# Patient Record
Sex: Male | Born: 1960 | Race: White | Hispanic: No | Marital: Married | Smoking: Never smoker
Health system: Southern US, Community
[De-identification: ages and names within clinical notes are randomized; demographics above are authoritative.]

---

## 2003-04-09 ENCOUNTER — Encounter: Payer: Self-pay | Admitting: *Deleted

## 2003-04-09 ENCOUNTER — Ambulatory Visit (HOSPITAL_COMMUNITY): Admission: RE | Admit: 2003-04-09 | Discharge: 2003-04-09 | Payer: Self-pay | Admitting: *Deleted

## 2005-12-02 ENCOUNTER — Ambulatory Visit (HOSPITAL_COMMUNITY): Admission: AD | Admit: 2005-12-02 | Discharge: 2005-12-02 | Payer: Self-pay | Admitting: Orthopedic Surgery

## 2007-07-14 IMAGING — RF DG CLAVICLE*R*
1 series · 6 of 6 positions shown · non-contrast
Comparison: none

CLINICAL DATA: ORIF clavicular fracture.  
 RIGHT CLAVICLE ? 5 FLUOROSCOPIC GUIDED SPOTS FILMS:

[Series 1: run · 6 of 6 slices shown]
[im 1/6]
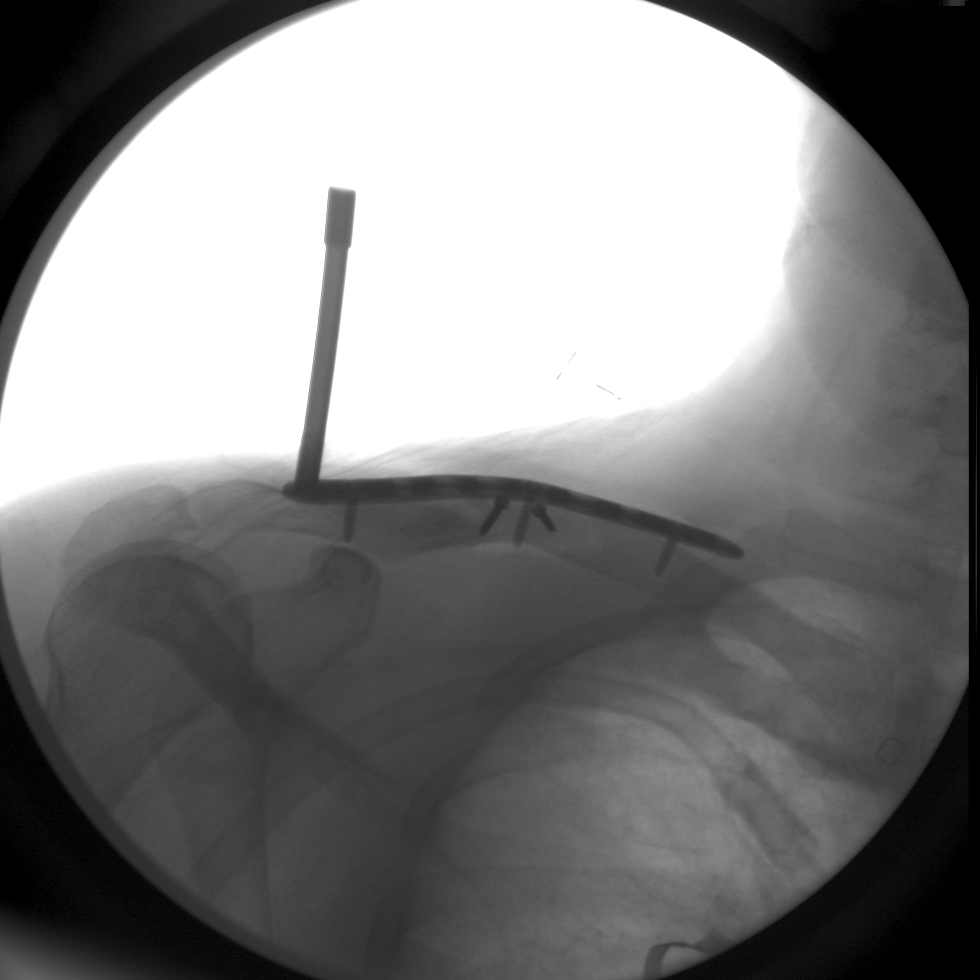
[im 2/6]
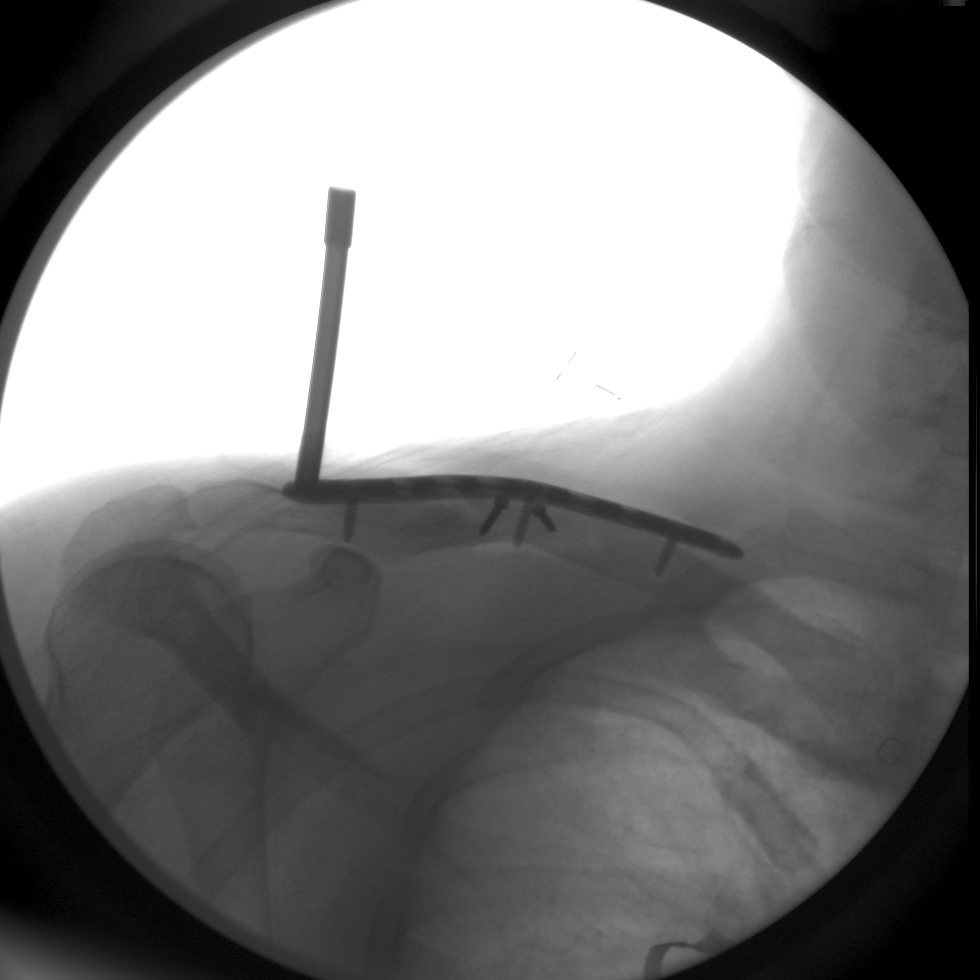
[im 3/6]
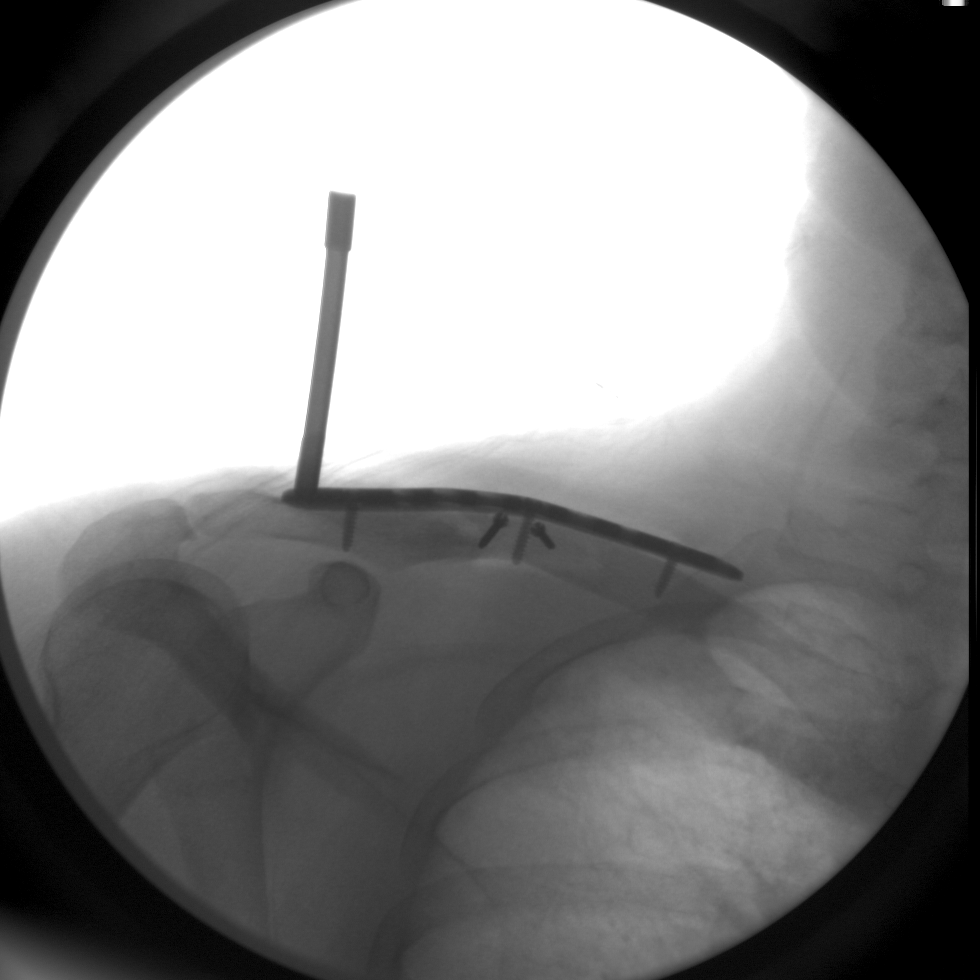
[im 4/6]
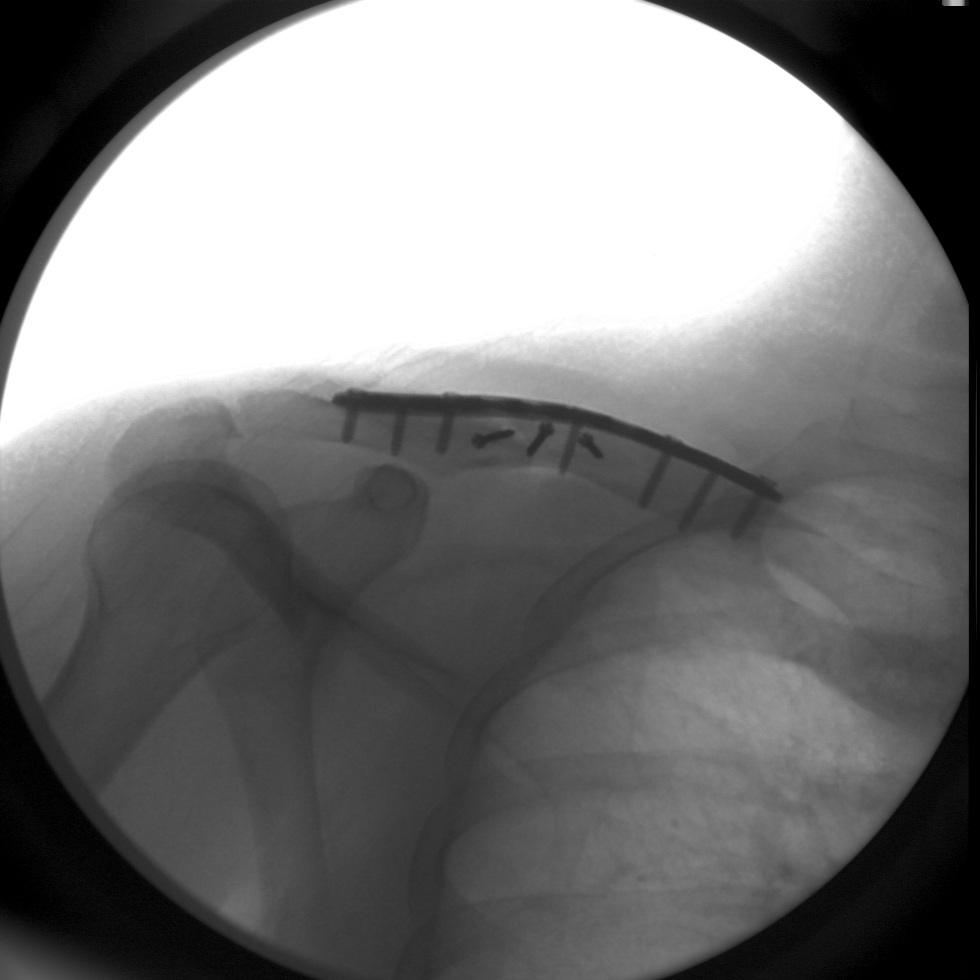
[im 5/6]
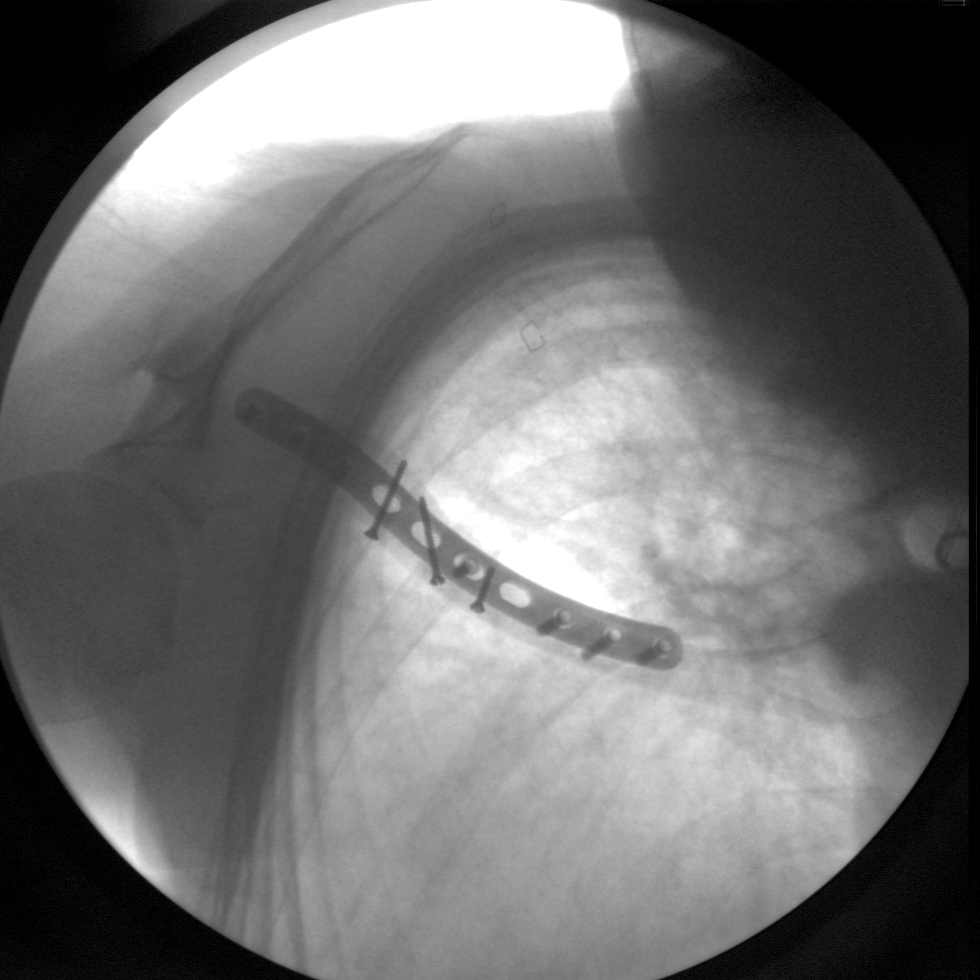
[im 6/6]
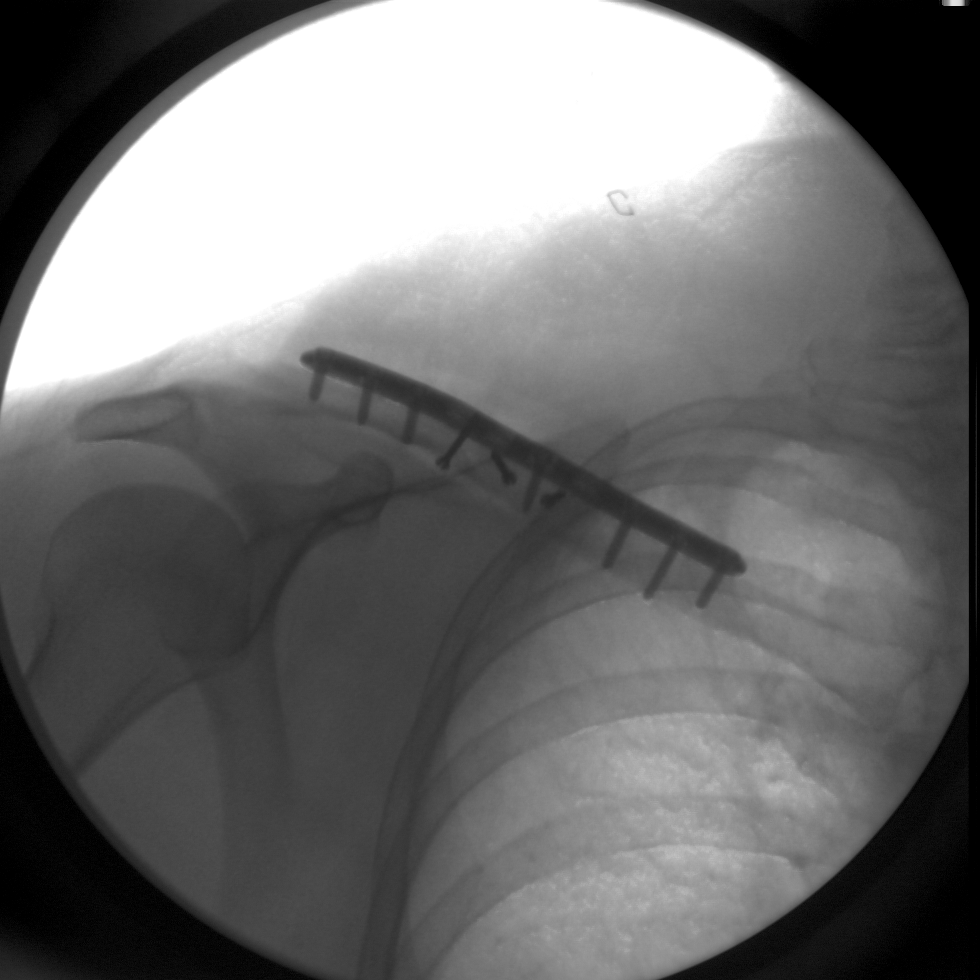

[6 of 6 positions shown; findings below may reference images not displayed]

FINDINGS: Right clavicular fracture has been reduced with contour plate and screw fixation.  Plate lies along the superior aspect of the clavicle.  Seven screws traverse the plate and clavicle and three screws are noted transfixing the fracture site without extending through the plate.  Fracture fragments appear in satisfactory position and alignment.
IMPRESSION: Satisfactory ORIF right clavicle.

## 2020-11-04 ENCOUNTER — Ambulatory Visit (INDEPENDENT_AMBULATORY_CARE_PROVIDER_SITE_OTHER): Payer: BC Managed Care – PPO | Admitting: Ophthalmology

## 2020-11-04 ENCOUNTER — Other Ambulatory Visit: Payer: Self-pay

## 2020-11-04 ENCOUNTER — Encounter (INDEPENDENT_AMBULATORY_CARE_PROVIDER_SITE_OTHER): Payer: Self-pay | Admitting: Ophthalmology

## 2020-11-04 DIAGNOSIS — H2013 Chronic iridocyclitis, bilateral: Secondary | ICD-10-CM | POA: Diagnosis not present

## 2020-11-04 DIAGNOSIS — H2513 Age-related nuclear cataract, bilateral: Secondary | ICD-10-CM

## 2020-11-04 DIAGNOSIS — H43812 Vitreous degeneration, left eye: Secondary | ICD-10-CM

## 2020-11-04 DIAGNOSIS — H43811 Vitreous degeneration, right eye: Secondary | ICD-10-CM | POA: Diagnosis not present

## 2020-11-04 DIAGNOSIS — H21543 Posterior synechiae (iris), bilateral: Secondary | ICD-10-CM | POA: Insufficient documentation

## 2020-11-04 MED ORDER — PREDNISOLONE ACETATE 1 % OP SUSP: 1.0000 [drp] | OPHTHALMIC | 0 refills | Status: AC

## 2020-11-04 NOTE — Progress Notes (Signed)
11/04/2020     CHIEF COMPLAINT Patient presents for Retina Follow Up   HISTORY OF PRESENT ILLNESS: Eric Ibarra is a 59 y.o. 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  Severity is moderate.  Duration of 1 year.  Since onset it is stable.  I, the attending physician,  performed the HPI with the patient and updated documentation appropriately.          Comments    1 Year f\u OU. OCT ,  With a history of chronic recurrent iritis no flareups for some years yet patient concerned he may have a current flareup, OD only  Pt c/o feeling pressure in OU and states he may have some inflammation x 3 weeks. Pt states he has been using readers more than usual.       Last edited by Edmon Crape, MD on 11/04/2020  9:05 AM. (History)      Referring physician: No referring provider defined for this encounter.  HISTORICAL INFORMATION:   Selected notes from the MEDICAL RECORD NUMBER       CURRENT MEDICATIONS: Current Outpatient Medications (Ophthalmic Drugs)  Medication Sig  . prednisoLONE acetate (PRED FORTE) 1 % ophthalmic suspension Place 1 drop into both eyes as directed. Patient to have this medication on hand as a as needed basis for mild iritis   No current facility-administered medications for this visit. (Ophthalmic Drugs)   No current outpatient medications on file. (Other)   No current facility-administered medications for this visit. (Other)      REVIEW OF SYSTEMS:    ALLERGIES No Known Allergies  PAST MEDICAL HISTORY History reviewed. No pertinent past medical history. History reviewed. No pertinent surgical history.  FAMILY HISTORY History reviewed. No pertinent family history.  SOCIAL HISTORY Social History   Tobacco Use  . Smoking status: Never Smoker  . Smokeless tobacco: Never Used  Substance Use Topics  . Alcohol use: Not on file  . Drug  use: Not on file         OPHTHALMIC EXAM:  Base Eye Exam    Visual Acuity (Snellen - Linear)      Right Left   Dist [place] 20/20 -2 20/20       Tonometry (Tonopen, 8:18 AM)      Right Left   Pressure 12 12       Pupils      Pupils Dark Light Shape React APD   Right PERRL 4 3 Round Brisk None   Left PERRL 4 3 Round Brisk None       Visual Fields (Counting fingers)      Left Right    Full Full       Neuro/Psych    Oriented x3: Yes   Mood/Affect: Normal       Dilation    Both eyes: 1.0% Mydriacyl, 2.5% Phenylephrine @ 8:18 AM        Slit Lamp and Fundus Exam    External Exam      Right Left   External Normal Normal       Slit Lamp Exam      Right Left   Lids/Lashes Normal Normal   Conjunctiva/Sclera White and quiet White and quiet   Cornea Clear Clear   Anterior Chamber Deep and quiet,, no cells, flare Deep and quiet,, no cells, flare  Iris Round and reactive Round and reactive   Lens 1+ Nuclear sclerosis 1+ Nuclear sclerosis   Anterior Vitreous Normal Normal       Fundus Exam      Right Left   Posterior Vitreous Normal Normal   Disc Normal Normal   C/D Ratio 0.2 0.2   Macula Normal Normal   Vessels Normal Normal   Periphery Normal Normal          IMAGING AND PROCEDURES  Imaging and Procedures for 11/18/20  OCT, Retina - OU - Both Eyes       Right Eye Quality was good. Scan locations included subfoveal. Central Foveal Thickness: 281. Progression has been stable. Findings include normal foveal contour, vitreomacular adhesion .   Left Eye Quality was good. Scan locations included subfoveal. Central Foveal Thickness: 283. Progression has been stable. Findings include normal foveal contour, vitreomacular adhesion .   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  An informational brochure was given to the patient.  All the patient's questions were answered.  The patient was asked to return if new or different flashes or floaters develops.   Patient was instructed to contact office immediately if any changes were noticed. I explained to the patient that vitreous inside the eye is similar to jello inside a bowl. As the jello melts it can start to pull away from the bowl, similarly the vitreous throughout our lives can begin to pull away from the retina. That process is called a posterior vitreous detachment. In some cases, the vitreous can tug hard enough on the retina to form a retinal tear. I discussed with the patient the signs and symptoms of a retinal detachment.  Do not rub the eye.      ICD-10-CM   1. Chronic iridocyclitis of both eyes  H20.13   2. Posterior synechiae (iris), bilateral  H21.543   3. Nuclear sclerotic cataract of both eyes  H25.13   4. Posterior vitreous detachment of right eye  H43.811 OCT, Retina - OU - Both  Eyes  5. Posterior vitreous detachment of left eye  H43.812 OCT, Retina - OU - Both Eyes    1.  No active disease OU, will continue to monitor  2.  Visually significant cataract OU  3.  Patient continues to self treat  periodically as his chronic iritis minimally flares over the years.  No other complications detected at this time  Ophthalmic Meds Ordered this visit:  Meds ordered this encounter  Medications  . prednisoLONE acetate (PRED FORTE) 1 % ophthalmic suspension    Sig: Place 1 drop into both eyes as directed. Patient to have this medication on hand as a as needed basis for mild iritis    Dispense:  5 mL    Refill:  0       Return in about 1 year (around 11/04/2021) for DILATE OU, OCT.  Patient Instructions  Patient experience in the use of tapering topical Pred forte, he will taper his current regimen to once daily for a week, once every other day for a week and then discontinue its use and use as needed basis only but in limited fashion no more than 2 to 3 weeks straight without being seen to confirm warm or assess for secondary side effect such as glaucoma    Explained the diagnoses, plan, and follow up with the patient and they expressed understanding.  Patient expressed understanding of the importance of proper follow up care.   Alford Highland Broxton Broady M.D. Diseases & Surgery of the Retina and Vitreous [facility] 11/18/20     Abbreviations: M myopia (nearsighted); A astigmatism; H hyperopia (farsighted); P presbyopia; Mrx spectacle prescription;  CTL contact lenses; OD right eye; OS left eye; OU both eyes  XT exotropia; ET esotropia; PEK punctate epithelial keratitis; PEE punctate epithelial erosions; DES dry eye syndrome; MGD meibomian gland dysfunction; ATs artificial tears; PFAT's preservative free artificial tears; NSC nuclear sclerotic cataract; PSC posterior subcapsular cataract; ERM epi-retinal membrane; PVD posterior vitreous detachment; RD retinal  detachment; DM diabetes mellitus; DR diabetic retinopathy; NPDR non-proliferative diabetic retinopathy; PDR proliferative diabetic retinopathy; CSME clinically significant macular edema; DME diabetic macular edema; dbh dot blot hemorrhages; CWS cotton wool spot; POAG primary open angle glaucoma; C/D cup-to-disc ratio; HVF humphrey visual field; GVF goldmann visual field; OCT optical coherence tomography; IOP intraocular pressure; BRVO Branch retinal vein occlusion; CRVO central retinal vein occlusion; CRAO central retinal artery occlusion; BRAO branch retinal artery occlusion; RT retinal tear; SB scleral buckle; PPV pars plana vitrectomy; VH Vitreous hemorrhage; PRP panretinal laser photocoagulation; IVK intravitreal kenalog; VMT vitreomacular traction; MH Macular hole;  NVD neovascularization of the disc; NVE neovascularization elsewhere; AREDS age related eye disease study; ARMD age related macular degeneration; POAG primary open angle glaucoma; EBMD epithelial/anterior basement membrane dystrophy; ACIOL anterior chamber intraocular lens; IOL intraocular lens; PCIOL posterior chamber intraocular lens; Phaco/IOL phacoemulsification with intraocular lens placement; PRK photorefractive keratectomy; LASIK laser assisted in situ keratomileusis; HTN hypertension; DM diabetes mellitus; COPD chronic obstructive pulmonary disease

## 2020-11-04 NOTE — Assessment & Plan Note (Signed)
History of chronic mild recurrent iridocyclitis most commonly in the right eye in the past, yet with a history of mild changes in the distant past currently inactive based upon 10 days of topical Pred forte use right eye twice daily.  Patient has used an old bottle of thus we will update his his medication so he will have this on hand should he have a flareup    No active scarring no active inflammation seen in the anterior vitreous nor in the anterior chamber of either eye today  Recommend taper of topical Pred forte to once daily for a week and then perhaps once every other day for a week and then stop it

## 2020-11-04 NOTE — Patient Instructions (Signed)
Patient experience in the use of tapering topical Pred forte, he will taper his current regimen to once daily for a week, once every other day for a week and then discontinue its use and use as needed basis only but in limited fashion no more than 2 to 3 weeks straight without being seen to confirm warm or assess for secondary side effect such as glaucoma

## 2020-11-18 NOTE — Assessment & Plan Note (Signed)
[place] ring pigment deposition anterior capsule, no scarring now

## 2020-11-18 NOTE — Assessment & Plan Note (Signed)

## 2020-11-18 NOTE — Assessment & Plan Note (Signed)
Minor and not visually significant

## 2021-11-05 ENCOUNTER — Encounter (INDEPENDENT_AMBULATORY_CARE_PROVIDER_SITE_OTHER): Payer: Self-pay | Admitting: Ophthalmology

## 2021-11-05 ENCOUNTER — Ambulatory Visit (INDEPENDENT_AMBULATORY_CARE_PROVIDER_SITE_OTHER): Payer: BC Managed Care – PPO | Admitting: Ophthalmology

## 2021-11-05 ENCOUNTER — Other Ambulatory Visit: Payer: Self-pay

## 2021-11-05 DIAGNOSIS — H2513 Age-related nuclear cataract, bilateral: Secondary | ICD-10-CM

## 2021-11-05 DIAGNOSIS — H43811 Vitreous degeneration, right eye: Secondary | ICD-10-CM

## 2021-11-05 DIAGNOSIS — H2013 Chronic iridocyclitis, bilateral: Secondary | ICD-10-CM | POA: Diagnosis not present

## 2021-11-05 DIAGNOSIS — H21543 Posterior synechiae (iris), bilateral: Secondary | ICD-10-CM

## 2021-11-05 NOTE — Progress Notes (Signed)
11/05/2021     CHIEF COMPLAINT Patient presents for  Chief Complaint  Patient presents with   Retina Follow Up      HISTORY OF PRESENT ILLNESS: Eric Ibarra is a 60 y.o. male who presents to the clinic today for:   HPI     Retina Follow Up   Patient presents with  Other.  In both eyes.  This started 1 year ago.  Duration of 1 year.  Since onset it is stable.        Comments   1 year f/u OU with OCT, no signs of recurrence is no symptoms or recurrences.  On no topical ocular therapy.  In the past the patient did use chronic nightly suppressive ophthalmic ointment, erythromycin or bacitracin for years prior to cessation some 7 years previous      Last edited by Edmon Crape, MD on 11/05/2021  8:54 AM.      Referring physician: No referring provider defined for this encounter.  HISTORICAL INFORMATION:   Selected notes from the MEDICAL RECORD NUMBER       CURRENT MEDICATIONS: No current outpatient medications on file. (Ophthalmic Drugs)   No current facility-administered medications for this visit. (Ophthalmic Drugs)   No current outpatient medications on file. (Other)   No current facility-administered medications for this visit. (Other)      REVIEW OF SYSTEMS:    ALLERGIES No Known Allergies  PAST MEDICAL HISTORY History reviewed. No pertinent past medical history. History reviewed. No pertinent surgical history.  FAMILY HISTORY History reviewed. No pertinent family history.  SOCIAL HISTORY Social History   Tobacco Use   Smoking status: Never   Smokeless tobacco: Never         OPHTHALMIC EXAM:  Base Eye Exam     Visual Acuity (ETDRS)       Right Left   Dist [place] 20/20 -2 20/20         Tonometry (Tonopen, 8:15 AM)       Right Left   Pressure 11 12         Pupils       Pupils Dark Light Shape React APD   Right PERRL 6 4 Round Brisk None   Left PERRL 6 4 Round Brisk None         Visual Fields (Counting  fingers)       Left Right    Full Full         Extraocular Movement       Right Left    Full, Ortho Full, Ortho         Neuro/Psych     Oriented x3: Yes   Mood/Affect: Normal         Dilation     Both eyes: 1.0% Mydriacyl, 2.5% Phenylephrine @ 8:15 AM           Slit Lamp and Fundus Exam     External Exam       Right Left   External Normal Normal         Slit Lamp Exam       Right Left   Lids/Lashes Normal Normal   Conjunctiva/Sclera White and quiet White and quiet   Cornea Clear Clear   Anterior Chamber Deep and quiet,, no cells, flare Deep and quiet,, no cells, flare   Iris Round and reactive Round and reactive   Lens Trace Nuclear sclerosis Trace Nuclear sclerosis   Anterior Vitreous Normal Normal  Fundus Exam       Right Left   Posterior Vitreous Posterior vitreous detachment Posterior vitreous detachment   Disc Normal Normal   C/D Ratio 0.2 0.2   Macula Normal Normal   Vessels Normal Normal   Periphery Normal Normal            IMAGING AND PROCEDURES  Imaging and Procedures for 11/05/21  OCT, Retina - OU - Both Eyes       Right Eye Quality was good. Scan locations included subfoveal. Central Foveal Thickness: 275. Progression has been stable. Findings include normal foveal contour, vitreomacular adhesion .   Left Eye Quality was good. Scan locations included subfoveal. Central Foveal Thickness: 279. Progression has been stable. Findings include normal foveal contour, vitreomacular adhesion .   Notes No signs of active maculopathy OU             ASSESSMENT/PLAN:  Chronic iridocyclitis of both eyes History of chronic recurrent iridocyclitis for many years last flareup some 5 to 8 years previous  No signs of complications in either eye  On no topical medications  Nuclear sclerotic cataract of both eyes Minor, not visually impactful  Posterior synechiae (iris), bilateral No active scarring  OU  Posterior vitreous detachment of right eye Physiologic OU     ICD-10-CM   1. Chronic iridocyclitis of both eyes  H20.13 OCT, Retina - OU - Both Eyes    2. Nuclear sclerotic cataract of both eyes  H25.13     3. Posterior synechiae (iris), bilateral  H21.543     4. Posterior vitreous detachment of right eye  H43.811       1.  Minor nuclear sclerotic cataract changes OU age-appropriate, not impactful on the vision  2.  No signs of chronic scarring or recurrence of chronic iridocyclitis, recurrent in the past not evident for at least 5 to 8 years roughly  3.  Ophthalmic Meds Ordered this visit:  No orders of the defined types were placed in this encounter.      Return in about 1 year (around 11/05/2022) for DILATE OU, OCT.  There are no Patient Instructions on file for this visit.   Explained the diagnoses, plan, and follow up with the patient and they expressed understanding.  Patient expressed understanding of the importance of proper follow up care.   Alford Highland Jermon Chalfant M.D. Diseases & Surgery of the Retina and Vitreous [facility] 11/05/21     Abbreviations: M myopia (nearsighted); A astigmatism; H hyperopia (farsighted); P presbyopia; Mrx spectacle prescription;  CTL contact lenses; OD right eye; OS left eye; OU both eyes  XT exotropia; ET esotropia; PEK punctate epithelial keratitis; PEE punctate epithelial erosions; DES dry eye syndrome; MGD meibomian gland dysfunction; ATs artificial tears; PFAT's preservative free artificial tears; NSC nuclear sclerotic cataract; PSC posterior subcapsular cataract; ERM epi-retinal membrane; PVD posterior vitreous detachment; RD retinal detachment; DM diabetes mellitus; DR diabetic retinopathy; NPDR non-proliferative diabetic retinopathy; PDR proliferative diabetic retinopathy; CSME clinically significant macular edema; DME diabetic macular edema; dbh dot blot hemorrhages; CWS cotton wool spot; POAG primary open angle  glaucoma; C/D cup-to-disc ratio; HVF humphrey visual field; GVF goldmann visual field; OCT optical coherence tomography; IOP intraocular pressure; BRVO Branch retinal vein occlusion; CRVO central retinal vein occlusion; CRAO central retinal artery occlusion; BRAO branch retinal artery occlusion; RT retinal tear; SB scleral buckle; PPV pars plana vitrectomy; VH Vitreous hemorrhage; PRP panretinal laser photocoagulation; IVK intravitreal kenalog; VMT vitreomacular traction; MH Macular hole;  NVD  neovascularization of the disc; NVE neovascularization elsewhere; AREDS age related eye disease study; ARMD age related macular degeneration; POAG primary open angle glaucoma; EBMD epithelial/anterior basement membrane dystrophy; ACIOL anterior chamber intraocular lens; IOL intraocular lens; PCIOL posterior chamber intraocular lens; Phaco/IOL phacoemulsification with intraocular lens placement; [place] photorefractive keratectomy; LASIK laser assisted in situ keratomileusis; HTN hypertension; DM diabetes mellitus; COPD chronic obstructive pulmonary disease

## 2021-11-05 NOTE — Assessment & Plan Note (Signed)
No active scarring OU

## 2021-11-05 NOTE — Assessment & Plan Note (Signed)
Minor, not visually impactful

## 2021-11-05 NOTE — Assessment & Plan Note (Signed)
Physiologic OU 

## 2021-11-05 NOTE — Assessment & Plan Note (Signed)
History of chronic recurrent iridocyclitis for many years last flareup some 5 to 8 years previous  No signs of complications in either eye  On no topical medications

## 2022-03-04 DIAGNOSIS — M25562 Pain in left knee: Secondary | ICD-10-CM | POA: Diagnosis not present

## 2022-03-04 DIAGNOSIS — M25561 Pain in right knee: Secondary | ICD-10-CM | POA: Diagnosis not present

## 2022-03-11 DIAGNOSIS — M531 Cervicobrachial syndrome: Secondary | ICD-10-CM | POA: Diagnosis not present

## 2022-04-29 DIAGNOSIS — M531 Cervicobrachial syndrome: Secondary | ICD-10-CM | POA: Diagnosis not present

## 2022-06-10 DIAGNOSIS — M531 Cervicobrachial syndrome: Secondary | ICD-10-CM | POA: Diagnosis not present

## 2022-07-22 DIAGNOSIS — M531 Cervicobrachial syndrome: Secondary | ICD-10-CM | POA: Diagnosis not present

## 2022-09-02 DIAGNOSIS — M9901 Segmental and somatic dysfunction of cervical region: Secondary | ICD-10-CM | POA: Diagnosis not present

## 2022-09-02 DIAGNOSIS — M50322 Other cervical disc degeneration at C5-C6 level: Secondary | ICD-10-CM | POA: Diagnosis not present

## 2022-09-02 DIAGNOSIS — M5417 Radiculopathy, lumbosacral region: Secondary | ICD-10-CM | POA: Diagnosis not present

## 2022-09-02 DIAGNOSIS — M531 Cervicobrachial syndrome: Secondary | ICD-10-CM | POA: Diagnosis not present

## 2022-10-14 DIAGNOSIS — M50322 Other cervical disc degeneration at C5-C6 level: Secondary | ICD-10-CM | POA: Diagnosis not present

## 2022-10-14 DIAGNOSIS — M531 Cervicobrachial syndrome: Secondary | ICD-10-CM | POA: Diagnosis not present

## 2022-10-14 DIAGNOSIS — M5417 Radiculopathy, lumbosacral region: Secondary | ICD-10-CM | POA: Diagnosis not present

## 2022-10-14 DIAGNOSIS — M9901 Segmental and somatic dysfunction of cervical region: Secondary | ICD-10-CM | POA: Diagnosis not present

## 2022-11-09 ENCOUNTER — Encounter (INDEPENDENT_AMBULATORY_CARE_PROVIDER_SITE_OTHER): Payer: BC Managed Care – PPO | Admitting: Ophthalmology

## 2024-01-02 DIAGNOSIS — M25512 Pain in left shoulder: Secondary | ICD-10-CM | POA: Diagnosis not present

## 2024-01-02 DIAGNOSIS — M25551 Pain in right hip: Secondary | ICD-10-CM | POA: Diagnosis not present

## 2024-01-02 DIAGNOSIS — M25552 Pain in left hip: Secondary | ICD-10-CM | POA: Diagnosis not present

## 2024-01-02 DIAGNOSIS — M25511 Pain in right shoulder: Secondary | ICD-10-CM | POA: Diagnosis not present

## 2024-02-06 DIAGNOSIS — M7502 Adhesive capsulitis of left shoulder: Secondary | ICD-10-CM | POA: Diagnosis not present

## 2024-02-06 DIAGNOSIS — R5383 Other fatigue: Secondary | ICD-10-CM | POA: Diagnosis not present

## 2024-02-06 DIAGNOSIS — T733XXA Exhaustion due to excessive exertion, initial encounter: Secondary | ICD-10-CM | POA: Diagnosis not present

## 2024-02-08 DIAGNOSIS — M25511 Pain in right shoulder: Secondary | ICD-10-CM | POA: Diagnosis not present

## 2024-02-08 DIAGNOSIS — M25512 Pain in left shoulder: Secondary | ICD-10-CM | POA: Diagnosis not present

## 2024-02-13 DIAGNOSIS — M25511 Pain in right shoulder: Secondary | ICD-10-CM | POA: Diagnosis not present

## 2024-02-13 DIAGNOSIS — M25512 Pain in left shoulder: Secondary | ICD-10-CM | POA: Diagnosis not present

## 2024-02-22 DIAGNOSIS — L409 Psoriasis, unspecified: Secondary | ICD-10-CM | POA: Diagnosis not present

## 2024-02-22 DIAGNOSIS — H209 Unspecified iridocyclitis: Secondary | ICD-10-CM | POA: Diagnosis not present

## 2024-02-22 DIAGNOSIS — Z125 Encounter for screening for malignant neoplasm of prostate: Secondary | ICD-10-CM | POA: Diagnosis not present

## 2024-02-22 DIAGNOSIS — M459 Ankylosing spondylitis of unspecified sites in spine: Secondary | ICD-10-CM | POA: Diagnosis not present

## 2024-02-22 DIAGNOSIS — Z1589 Genetic susceptibility to other disease: Secondary | ICD-10-CM | POA: Diagnosis not present

## 2024-03-06 DIAGNOSIS — M549 Dorsalgia, unspecified: Secondary | ICD-10-CM | POA: Diagnosis not present

## 2024-03-06 DIAGNOSIS — M459 Ankylosing spondylitis of unspecified sites in spine: Secondary | ICD-10-CM | POA: Diagnosis not present

## 2024-03-06 DIAGNOSIS — M25511 Pain in right shoulder: Secondary | ICD-10-CM | POA: Diagnosis not present

## 2024-03-06 DIAGNOSIS — H209 Unspecified iridocyclitis: Secondary | ICD-10-CM | POA: Diagnosis not present

## 2024-03-06 DIAGNOSIS — L409 Psoriasis, unspecified: Secondary | ICD-10-CM | POA: Diagnosis not present

## 2024-05-28 DIAGNOSIS — H43813 Vitreous degeneration, bilateral: Secondary | ICD-10-CM | POA: Diagnosis not present

## 2024-05-28 DIAGNOSIS — H2011 Chronic iridocyclitis, right eye: Secondary | ICD-10-CM | POA: Diagnosis not present

## 2024-05-28 DIAGNOSIS — H21543 Posterior synechiae (iris), bilateral: Secondary | ICD-10-CM | POA: Diagnosis not present

## 2024-05-28 DIAGNOSIS — H2513 Age-related nuclear cataract, bilateral: Secondary | ICD-10-CM | POA: Diagnosis not present

## 2024-05-30 DIAGNOSIS — M459 Ankylosing spondylitis of unspecified sites in spine: Secondary | ICD-10-CM | POA: Diagnosis not present

## 2024-05-30 DIAGNOSIS — E7800 Pure hypercholesterolemia, unspecified: Secondary | ICD-10-CM | POA: Diagnosis not present

## 2024-06-04 DIAGNOSIS — H2011 Chronic iridocyclitis, right eye: Secondary | ICD-10-CM | POA: Diagnosis not present

## 2024-06-04 DIAGNOSIS — H2513 Age-related nuclear cataract, bilateral: Secondary | ICD-10-CM | POA: Diagnosis not present

## 2024-06-04 DIAGNOSIS — H21543 Posterior synechiae (iris), bilateral: Secondary | ICD-10-CM | POA: Diagnosis not present

## 2024-06-05 DIAGNOSIS — H2513 Age-related nuclear cataract, bilateral: Secondary | ICD-10-CM | POA: Diagnosis not present

## 2024-06-05 DIAGNOSIS — H43813 Vitreous degeneration, bilateral: Secondary | ICD-10-CM | POA: Diagnosis not present

## 2024-06-05 DIAGNOSIS — H2011 Chronic iridocyclitis, right eye: Secondary | ICD-10-CM | POA: Diagnosis not present

## 2024-06-06 DIAGNOSIS — M1990 Unspecified osteoarthritis, unspecified site: Secondary | ICD-10-CM | POA: Diagnosis not present

## 2024-06-06 DIAGNOSIS — E7800 Pure hypercholesterolemia, unspecified: Secondary | ICD-10-CM | POA: Diagnosis not present

## 2024-06-06 DIAGNOSIS — M459 Ankylosing spondylitis of unspecified sites in spine: Secondary | ICD-10-CM | POA: Diagnosis not present

## 2024-06-06 DIAGNOSIS — Z0000 Encounter for general adult medical examination without abnormal findings: Secondary | ICD-10-CM | POA: Diagnosis not present

## 2024-06-07 DIAGNOSIS — H43811 Vitreous degeneration, right eye: Secondary | ICD-10-CM | POA: Diagnosis not present

## 2024-06-07 DIAGNOSIS — H2011 Chronic iridocyclitis, right eye: Secondary | ICD-10-CM | POA: Diagnosis not present

## 2024-06-07 DIAGNOSIS — H21543 Posterior synechiae (iris), bilateral: Secondary | ICD-10-CM | POA: Diagnosis not present

## 2024-06-07 DIAGNOSIS — H2513 Age-related nuclear cataract, bilateral: Secondary | ICD-10-CM | POA: Diagnosis not present

## 2024-06-12 DIAGNOSIS — H43811 Vitreous degeneration, right eye: Secondary | ICD-10-CM | POA: Diagnosis not present

## 2024-06-12 DIAGNOSIS — H21543 Posterior synechiae (iris), bilateral: Secondary | ICD-10-CM | POA: Diagnosis not present

## 2024-06-12 DIAGNOSIS — H2011 Chronic iridocyclitis, right eye: Secondary | ICD-10-CM | POA: Diagnosis not present

## 2024-06-12 DIAGNOSIS — H2513 Age-related nuclear cataract, bilateral: Secondary | ICD-10-CM | POA: Diagnosis not present

## 2024-06-20 DIAGNOSIS — H21543 Posterior synechiae (iris), bilateral: Secondary | ICD-10-CM | POA: Diagnosis not present

## 2024-06-20 DIAGNOSIS — H43811 Vitreous degeneration, right eye: Secondary | ICD-10-CM | POA: Diagnosis not present

## 2024-06-20 DIAGNOSIS — H2513 Age-related nuclear cataract, bilateral: Secondary | ICD-10-CM | POA: Diagnosis not present

## 2024-06-20 DIAGNOSIS — H2011 Chronic iridocyclitis, right eye: Secondary | ICD-10-CM | POA: Diagnosis not present

## 2024-06-27 DIAGNOSIS — H2011 Chronic iridocyclitis, right eye: Secondary | ICD-10-CM | POA: Diagnosis not present

## 2024-06-27 DIAGNOSIS — H2513 Age-related nuclear cataract, bilateral: Secondary | ICD-10-CM | POA: Diagnosis not present

## 2024-06-27 DIAGNOSIS — H43812 Vitreous degeneration, left eye: Secondary | ICD-10-CM | POA: Diagnosis not present

## 2024-06-27 DIAGNOSIS — H35351 Cystoid macular degeneration, right eye: Secondary | ICD-10-CM | POA: Diagnosis not present

## 2024-07-04 DIAGNOSIS — H35351 Cystoid macular degeneration, right eye: Secondary | ICD-10-CM | POA: Diagnosis not present

## 2024-07-04 DIAGNOSIS — H2513 Age-related nuclear cataract, bilateral: Secondary | ICD-10-CM | POA: Diagnosis not present

## 2024-07-04 DIAGNOSIS — H2011 Chronic iridocyclitis, right eye: Secondary | ICD-10-CM | POA: Diagnosis not present

## 2024-07-04 DIAGNOSIS — H21543 Posterior synechiae (iris), bilateral: Secondary | ICD-10-CM | POA: Diagnosis not present

## 2024-07-06 DIAGNOSIS — H30031 Focal chorioretinal inflammation, peripheral, right eye: Secondary | ICD-10-CM | POA: Diagnosis not present

## 2024-07-06 DIAGNOSIS — H3581 Retinal edema: Secondary | ICD-10-CM | POA: Diagnosis not present

## 2024-07-10 DIAGNOSIS — H30031 Focal chorioretinal inflammation, peripheral, right eye: Secondary | ICD-10-CM | POA: Diagnosis not present

## 2024-07-11 DIAGNOSIS — H21543 Posterior synechiae (iris), bilateral: Secondary | ICD-10-CM | POA: Diagnosis not present

## 2024-07-11 DIAGNOSIS — H2011 Chronic iridocyclitis, right eye: Secondary | ICD-10-CM | POA: Diagnosis not present

## 2024-07-11 DIAGNOSIS — H2513 Age-related nuclear cataract, bilateral: Secondary | ICD-10-CM | POA: Diagnosis not present

## 2024-07-11 DIAGNOSIS — H35351 Cystoid macular degeneration, right eye: Secondary | ICD-10-CM | POA: Diagnosis not present

## 2024-07-25 DIAGNOSIS — H2011 Chronic iridocyclitis, right eye: Secondary | ICD-10-CM | POA: Diagnosis not present

## 2024-07-25 DIAGNOSIS — H43812 Vitreous degeneration, left eye: Secondary | ICD-10-CM | POA: Diagnosis not present

## 2024-07-25 DIAGNOSIS — H43811 Vitreous degeneration, right eye: Secondary | ICD-10-CM | POA: Diagnosis not present

## 2024-07-25 DIAGNOSIS — H21543 Posterior synechiae (iris), bilateral: Secondary | ICD-10-CM | POA: Diagnosis not present

## 2024-07-25 DIAGNOSIS — H35351 Cystoid macular degeneration, right eye: Secondary | ICD-10-CM | POA: Diagnosis not present

## 2024-07-25 DIAGNOSIS — H2513 Age-related nuclear cataract, bilateral: Secondary | ICD-10-CM | POA: Diagnosis not present

## 2024-08-08 DIAGNOSIS — H2513 Age-related nuclear cataract, bilateral: Secondary | ICD-10-CM | POA: Diagnosis not present

## 2024-08-08 DIAGNOSIS — H21543 Posterior synechiae (iris), bilateral: Secondary | ICD-10-CM | POA: Diagnosis not present

## 2024-08-08 DIAGNOSIS — H35351 Cystoid macular degeneration, right eye: Secondary | ICD-10-CM | POA: Diagnosis not present

## 2024-08-08 DIAGNOSIS — H2011 Chronic iridocyclitis, right eye: Secondary | ICD-10-CM | POA: Diagnosis not present

## 2024-08-28 DIAGNOSIS — H30031 Focal chorioretinal inflammation, peripheral, right eye: Secondary | ICD-10-CM | POA: Diagnosis not present

## 2024-08-28 DIAGNOSIS — Z79899 Other long term (current) drug therapy: Secondary | ICD-10-CM | POA: Diagnosis not present

## 2024-08-28 DIAGNOSIS — H3581 Retinal edema: Secondary | ICD-10-CM | POA: Diagnosis not present

## 2024-08-29 DIAGNOSIS — Z79899 Other long term (current) drug therapy: Secondary | ICD-10-CM | POA: Diagnosis not present

## 2024-08-29 DIAGNOSIS — H30031 Focal chorioretinal inflammation, peripheral, right eye: Secondary | ICD-10-CM | POA: Diagnosis not present

## 2024-09-05 DIAGNOSIS — H43812 Vitreous degeneration, left eye: Secondary | ICD-10-CM | POA: Diagnosis not present

## 2024-09-05 DIAGNOSIS — H2513 Age-related nuclear cataract, bilateral: Secondary | ICD-10-CM | POA: Diagnosis not present

## 2024-09-05 DIAGNOSIS — H43813 Vitreous degeneration, bilateral: Secondary | ICD-10-CM | POA: Diagnosis not present

## 2024-09-05 DIAGNOSIS — H43811 Vitreous degeneration, right eye: Secondary | ICD-10-CM | POA: Diagnosis not present

## 2024-09-05 DIAGNOSIS — H35351 Cystoid macular degeneration, right eye: Secondary | ICD-10-CM | POA: Diagnosis not present

## 2024-09-05 DIAGNOSIS — H21543 Posterior synechiae (iris), bilateral: Secondary | ICD-10-CM | POA: Diagnosis not present

## 2024-09-10 DIAGNOSIS — M459 Ankylosing spondylitis of unspecified sites in spine: Secondary | ICD-10-CM | POA: Diagnosis not present

## 2024-09-10 DIAGNOSIS — L409 Psoriasis, unspecified: Secondary | ICD-10-CM | POA: Diagnosis not present

## 2024-09-10 DIAGNOSIS — H209 Unspecified iridocyclitis: Secondary | ICD-10-CM | POA: Diagnosis not present

## 2024-09-10 DIAGNOSIS — M1990 Unspecified osteoarthritis, unspecified site: Secondary | ICD-10-CM | POA: Diagnosis not present

## 2024-09-19 DIAGNOSIS — H30031 Focal chorioretinal inflammation, peripheral, right eye: Secondary | ICD-10-CM | POA: Diagnosis not present

## 2024-10-03 DIAGNOSIS — H21543 Posterior synechiae (iris), bilateral: Secondary | ICD-10-CM | POA: Diagnosis not present

## 2024-10-03 DIAGNOSIS — H35351 Cystoid macular degeneration, right eye: Secondary | ICD-10-CM | POA: Diagnosis not present

## 2024-10-03 DIAGNOSIS — H2513 Age-related nuclear cataract, bilateral: Secondary | ICD-10-CM | POA: Diagnosis not present

## 2024-10-03 DIAGNOSIS — H2011 Chronic iridocyclitis, right eye: Secondary | ICD-10-CM | POA: Diagnosis not present

## 2024-11-05 DIAGNOSIS — H2011 Chronic iridocyclitis, right eye: Secondary | ICD-10-CM | POA: Diagnosis not present

## 2024-11-05 DIAGNOSIS — H21543 Posterior synechiae (iris), bilateral: Secondary | ICD-10-CM | POA: Diagnosis not present

## 2024-11-05 DIAGNOSIS — H35351 Cystoid macular degeneration, right eye: Secondary | ICD-10-CM | POA: Diagnosis not present

## 2024-11-05 DIAGNOSIS — H2513 Age-related nuclear cataract, bilateral: Secondary | ICD-10-CM | POA: Diagnosis not present

## 2024-12-06 DIAGNOSIS — H2513 Age-related nuclear cataract, bilateral: Secondary | ICD-10-CM | POA: Diagnosis not present

## 2024-12-06 DIAGNOSIS — H21543 Posterior synechiae (iris), bilateral: Secondary | ICD-10-CM | POA: Diagnosis not present

## 2024-12-06 DIAGNOSIS — H35351 Cystoid macular degeneration, right eye: Secondary | ICD-10-CM | POA: Diagnosis not present

## 2024-12-06 DIAGNOSIS — H2011 Chronic iridocyclitis, right eye: Secondary | ICD-10-CM | POA: Diagnosis not present
